# Patient Record
Sex: Male | Born: 1960 | Race: White | Hispanic: No | Marital: Single
Health system: Southern US, Community
[De-identification: ages and names within clinical notes are randomized; demographics above are authoritative.]

## PROBLEM LIST (undated history)

## (undated) DIAGNOSIS — N289 Disorder of kidney and ureter, unspecified: Secondary | ICD-10-CM

## (undated) DIAGNOSIS — E119 Type 2 diabetes mellitus without complications: Secondary | ICD-10-CM

---

## 2020-01-13 ENCOUNTER — Emergency Department (HOSPITAL_BASED_OUTPATIENT_CLINIC_OR_DEPARTMENT_OTHER)
Admission: EM | Admit: 2020-01-13 | Discharge: 2020-01-13 | Disposition: A | Discharge status: Home / Self Care | Payer: Medicaid Other | Attending: Emergency Medicine | Admitting: Emergency Medicine

## 2020-01-13 ENCOUNTER — Encounter (HOSPITAL_BASED_OUTPATIENT_CLINIC_OR_DEPARTMENT_OTHER): Payer: Self-pay | Admitting: *Deleted

## 2020-01-13 ENCOUNTER — Emergency Department (HOSPITAL_BASED_OUTPATIENT_CLINIC_OR_DEPARTMENT_OTHER): Payer: Medicaid Other

## 2020-01-13 ENCOUNTER — Other Ambulatory Visit: Payer: Self-pay

## 2020-01-13 DIAGNOSIS — Y9389 Activity, other specified: Secondary | ICD-10-CM | POA: Diagnosis not present

## 2020-01-13 DIAGNOSIS — Z7984 Long term (current) use of oral hypoglycemic drugs: Secondary | ICD-10-CM | POA: Diagnosis not present

## 2020-01-13 DIAGNOSIS — T148XXA Other injury of unspecified body region, initial encounter: Secondary | ICD-10-CM

## 2020-01-13 DIAGNOSIS — Y999 Unspecified external cause status: Secondary | ICD-10-CM | POA: Insufficient documentation

## 2020-01-13 DIAGNOSIS — M79675 Pain in left toe(s): Secondary | ICD-10-CM | POA: Insufficient documentation

## 2020-01-13 DIAGNOSIS — X58XXXA Exposure to other specified factors, initial encounter: Secondary | ICD-10-CM | POA: Diagnosis not present

## 2020-01-13 DIAGNOSIS — S91205A Unspecified open wound of left lesser toe(s) with damage to nail, initial encounter: Secondary | ICD-10-CM | POA: Diagnosis present

## 2020-01-13 DIAGNOSIS — Y92002 Bathroom of unspecified non-institutional (private) residence single-family (private) house as the place of occurrence of the external cause: Secondary | ICD-10-CM | POA: Insufficient documentation

## 2020-01-13 DIAGNOSIS — E119 Type 2 diabetes mellitus without complications: Secondary | ICD-10-CM | POA: Diagnosis not present

## 2020-01-13 DIAGNOSIS — S90415A Abrasion, left lesser toe(s), initial encounter: Secondary | ICD-10-CM | POA: Insufficient documentation

## 2020-01-13 HISTORY — DX: Type 2 diabetes mellitus without complications: E11.9

## 2020-01-13 HISTORY — DX: Disorder of kidney and ureter, unspecified: N28.9

## 2020-01-13 NOTE — ED Triage Notes (Signed)
C/o 2 small areas on 2nd toe left foot bleeding onset during the night  Denies pain

## 2020-01-13 NOTE — Discharge Instructions (Addendum)
Continue your home medications as previously prescribed. Follow-up with your wound care specialist and your surgeon. Return to the ER for any worsening pain, redness, swelling, injuries or falls.

## 2020-01-13 NOTE — ED Provider Notes (Signed)
[facility] EMERGENCY DEPARTMENT Provider Note   CSN: [id number] Arrival date & time: 01/13/20  0841     History Chief Complaint  Patient presents with  . Toe Pain    Jose Delgado is a 59 y.o. male with a past medical history of IDDM, presenting to the ED with a chief complaint of left second toe wound.  This morning woke up, was on his way to the bathroom when he noticed some bleeding on his left second toe.  He is status post amputation of the left fifth digit approximately 2 weeks ago.  He has not yet followed up with orthopedics or wound care regarding this.  He denies any fever, chills, numbness, redness or warmth of foot.  He remains ambulatory with cane as baseline. Denies injury to toe.   HPI     Past Medical History:  Diagnosis Date  . Diabetes mellitus without complication (HCC)   . Renal disorder     There are no problems to display for this patient.    No family history on file.  Social History   Tobacco Use  . Smoking status: Not on file  Substance Use Topics  . Alcohol use: Not on file  . Drug use: Not on file    Home Medications Prior to Admission medications   Medication Sig Start Date End Date Taking? Authorizing Provider  doxycycline (VIBRAMYCIN) 100 MG capsule Take 100 mg by mouth 2 (two) times daily.   Yes [provider]  furosemide (LASIX) 20 MG tablet Take 20 mg by mouth.   Yes [provider]  insulin glargine (LANTUS) 100 UNIT/ML injection Inject 22 Units into the skin daily.   Yes [provider]    Allergies    Hydrochlorothiazide  Review of Systems   Review of Systems  Constitutional: Negative for chills and fever.  Skin: Positive for wound.  Neurological: Negative for weakness and numbness.    Physical Exam Updated Vital Signs BP (!) 164/83 (BP Location: Right Arm)   Pulse 74   Temp 99 F (37.2 C)   Resp 18   Ht 5\' 7"  (1.702 m)   Wt 128 kg   SpO2 97%   BMI 44.20 kg/m   Physical  Exam Vitals and nursing note reviewed.  Constitutional:      General: He is not in acute distress.    Appearance: He is well-developed. He is not diaphoretic.  HENT:     Head: Normocephalic and atraumatic.  Eyes:     General: No scleral icterus.    Conjunctiva/sclera: Conjunctivae normal.  Pulmonary:     Effort: Pulmonary effort is normal. No respiratory distress.  Musculoskeletal:     Cervical back: Normal range of motion.  Skin:    Findings: No erythema or rash.     Comments: Physical exam findings as noted below.  There is appears to be a superficial abrasion of the left second toe without any active bleeding.  2+ DP pulse noted.  There is an amputation of the left fifth digit of the foot with stitches in place.  No diffuse erythema or warmth of the foot noted.  No tenderness to palpation.  Neurological:     Mental Status: He is alert.           ED Results / Procedures / Treatments   Labs (all labs ordered are listed, but only abnormal results are displayed) Labs Reviewed - No data to display  EKG None  Radiology DG Foot Complete  Left  Result Date: 01/13/2020 CLINICAL DATA:  Second toe bleeding. History of diabetes and 5th toe amputation. EXAM: LEFT FOOT - COMPLETE 3+ VIEW COMPARISON:  Radiographs 01/01/2020. FINDINGS: The bones are diffusely demineralized. Interval amputation of the small toe. No evidence of acute fracture, dislocation or recurrent bone destruction. There are diffuse vascular calcifications. Forefoot soft tissue swelling is improved compared with the prior study. No evidence of foreign body or soft tissue emphysema. IMPRESSION: 1. Interval amputation of the small toe without evidence of osteomyelitis or other acute osseous findings. 2. Diffuse vascular calcifications. Electronically Signed   By: Richardean Sale M.D.   On: 01/13/2020 10:58    Procedures Procedures (including critical care time)  Medications Ordered in ED Medications - No data to  display  ED Course  I have reviewed the triage vital signs and the nursing notes.  Pertinent labs & imaging results that were available during my care of the patient were reviewed by me and considered in my medical decision making (see chart for details).    MDM Rules/Calculators/A&P                          59 year old male with past medical history of IDDM, recent second fifth digit amputation of left foot presenting to the ED with left second toe wound that he noticed this morning.  Reports of bleeding from the indicated area as noted in the image above.  States that he is not followed up with wound care or surgeon since his amputation has not had stitches removed.  On exam there are 2+ DP pulses noted of the left foot.  There does appear to be superficial abrasion of the left second toe without tenderness palpation or changes to range of motion.  Normal sensation and strength noted.  X-ray shows no evidence of osteomyelitis or other abnormalities.  No evidence of cellulitis on exam.  We will have him continue wound care, follow-up with specialist for suture removal.  Patient is agreeable to the plan.  Remains ambulatory at baseline with postop shoe and cane.  All imaging, if done today, including plain films, CT scans, and ultrasounds, independently reviewed by me, and interpretations confirmed via formal radiology reads.  Patient is hemodynamically stable, in NAD, and able to ambulate in the ED. Evaluation does not show pathology that would require ongoing emergent intervention or inpatient treatment. I explained the diagnosis to the patient. Pain has been managed and has no complaints prior to discharge. Patient is comfortable with above plan and is stable for discharge at this time. All questions were answered prior to disposition. Strict return precautions for returning to the ED were discussed. Encouraged follow up with PCP.   An After Visit Summary was printed and given to the  patient.   Portions of this note were generated with [employer]. Dictation errors may occur despite best attempts at proofreading.  Final Clinical Impression(s) / ED Diagnoses Final diagnoses:  Skin abrasion    Rx / DC Orders ED Discharge Orders    None       Delia Heady, PA-C 01/13/20 1117    Sherwood Gambler, MD 01/13/20 1742

## 2020-10-23 IMAGING — DX DG FOOT COMPLETE 3+V*L*
3 series · 3 of 3 positions shown · non-contrast
Comparison: Radiographs 01/01/2020.

CLINICAL DATA: Second toe bleeding. History of diabetes and 5th toe
amputation.

EXAM:
LEFT FOOT - COMPLETE 3+ VIEW

[foot ap]
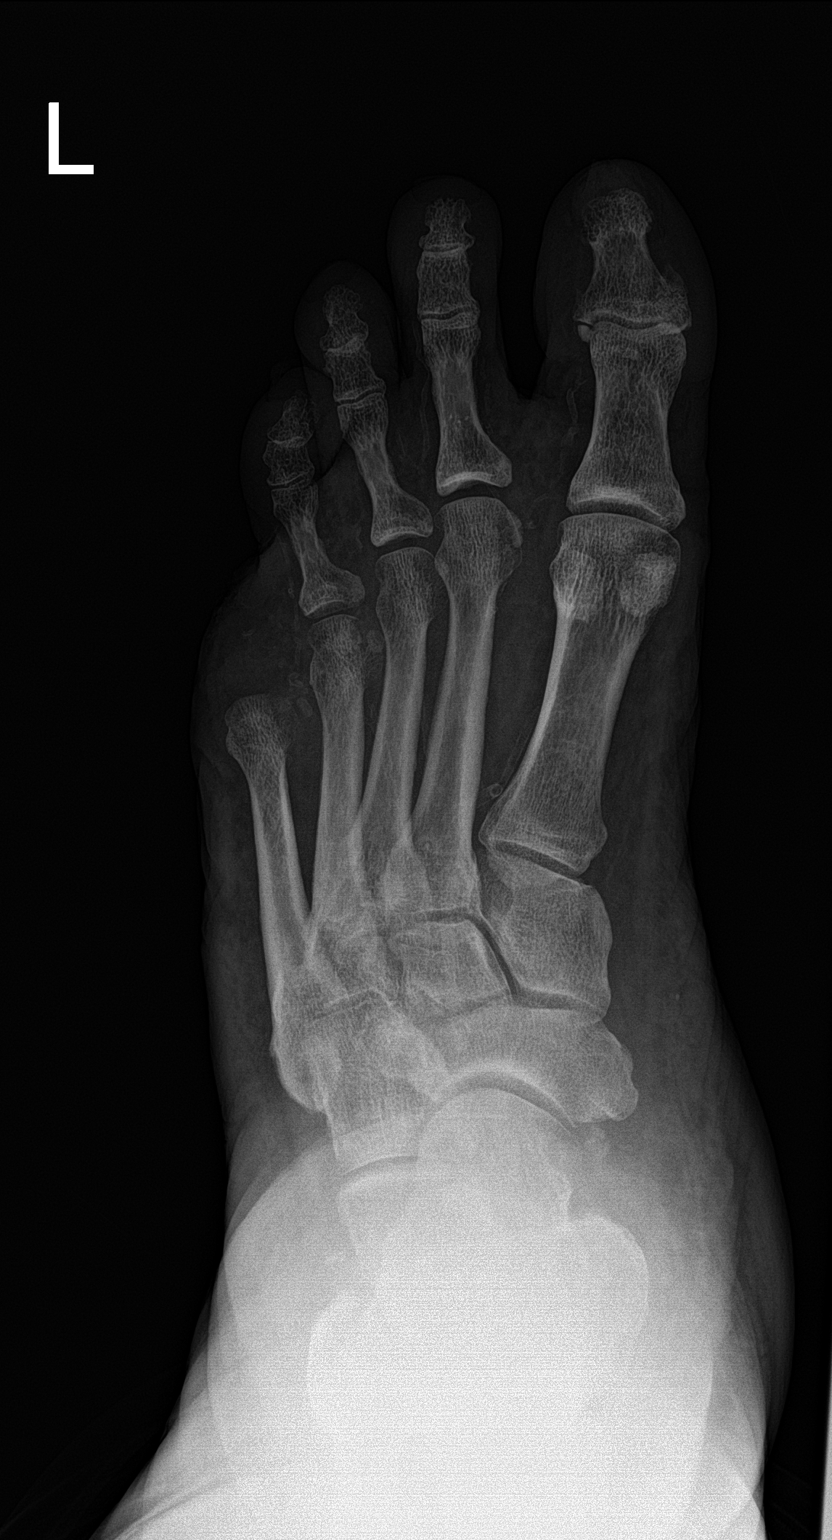

[foot obl]
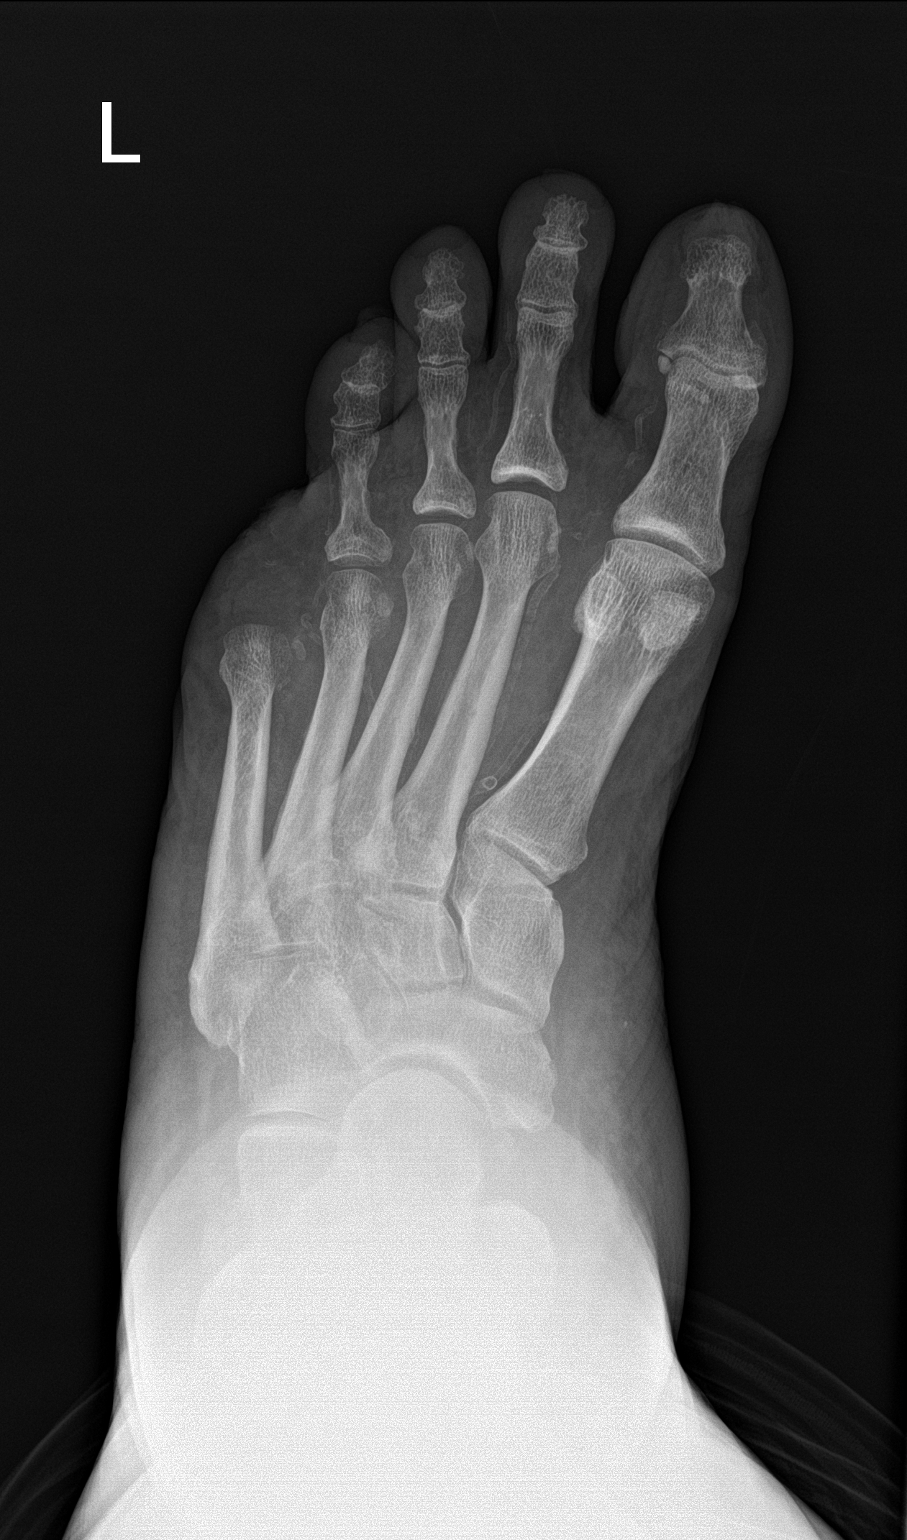

[foot lat]
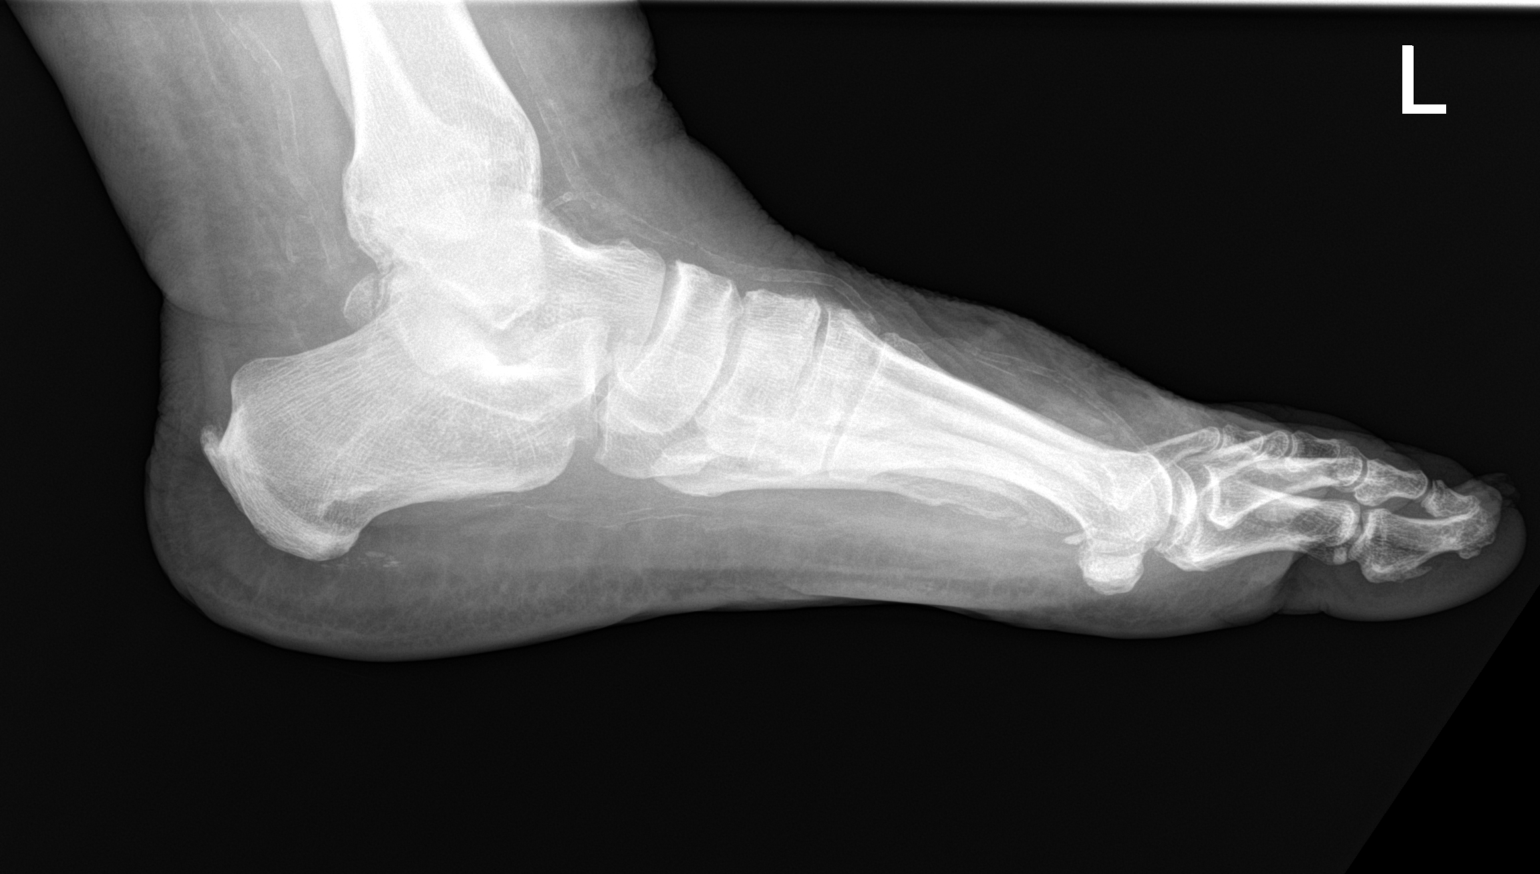

[3 of 3 positions shown; findings below may reference images not displayed]

FINDINGS: The bones are diffusely demineralized. Interval amputation of the
small toe. No evidence of acute fracture, dislocation or recurrent
bone destruction. There are diffuse vascular calcifications.
Forefoot soft tissue swelling is improved compared with the prior
study. No evidence of foreign body or soft tissue emphysema.
IMPRESSION: 1. Interval amputation of the small toe without evidence of
osteomyelitis or other acute osseous findings.
2. Diffuse vascular calcifications.
